# Patient Record
Sex: Female | Born: 2000 | Race: Black or African American | Hispanic: No | Marital: Single | Smoking: Never smoker
Health system: Southern US, Community
[De-identification: ages and names within clinical notes are randomized; demographics above are authoritative.]

## PROBLEM LIST (undated history)

## (undated) ENCOUNTER — Ambulatory Visit: Admission: EM | Payer: MEDICAID | Source: Home / Self Care

## (undated) ENCOUNTER — Ambulatory Visit: Payer: MEDICAID | Source: Home / Self Care

## (undated) DIAGNOSIS — L309 Dermatitis, unspecified: Secondary | ICD-10-CM

## (undated) DIAGNOSIS — J45909 Unspecified asthma, uncomplicated: Secondary | ICD-10-CM

## (undated) DIAGNOSIS — R41840 Attention and concentration deficit: Secondary | ICD-10-CM

## (undated) DIAGNOSIS — J302 Other seasonal allergic rhinitis: Secondary | ICD-10-CM

## (undated) HISTORY — PX: TONSILLECTOMY: SUR1361

---
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
## 2022-03-12 ENCOUNTER — Ambulatory Visit (INDEPENDENT_AMBULATORY_CARE_PROVIDER_SITE_OTHER): Payer: Medicaid Other

## 2022-03-12 ENCOUNTER — Ambulatory Visit
Admission: EM | Admit: 2022-03-12 | Discharge: 2022-03-12 | Disposition: A | Discharge status: Home / Self Care | Payer: Medicaid Other | Attending: Emergency Medicine | Admitting: Emergency Medicine

## 2022-03-12 DIAGNOSIS — R509 Fever, unspecified: Secondary | ICD-10-CM

## 2022-03-12 DIAGNOSIS — J208 Acute bronchitis due to other specified organisms: Secondary | ICD-10-CM

## 2022-03-12 DIAGNOSIS — R059 Cough, unspecified: Secondary | ICD-10-CM | POA: Diagnosis not present

## 2022-03-12 DIAGNOSIS — B9689 Other specified bacterial agents as the cause of diseases classified elsewhere: Secondary | ICD-10-CM

## 2022-03-12 DIAGNOSIS — R0689 Other abnormalities of breathing: Secondary | ICD-10-CM | POA: Diagnosis not present

## 2022-03-12 MED ORDER — GUAIFENESIN 400 MG PO TABS: ORAL_TABLET | ORAL | 0 refills | Status: DC

## 2022-03-12 MED ORDER — ALBUTEROL SULFATE HFA 108 (90 BASE) MCG/ACT IN AERS: 2.0000 | INHALATION_SPRAY | Freq: Four times a day (QID) | RESPIRATORY_TRACT | 0 refills | Status: DC | PRN

## 2022-03-12 MED ORDER — PROMETHAZINE-DM 6.25-15 MG/5ML PO SYRP: 5.0000 mL | ORAL_SOLUTION | Freq: Four times a day (QID) | ORAL | 0 refills | Status: DC | PRN

## 2022-03-12 MED ORDER — AMOXICILLIN-POT CLAVULANATE 875-125 MG PO TABS: 1.0000 | ORAL_TABLET | Freq: Two times a day (BID) | ORAL | 0 refills | Status: AC

## 2022-03-12 NOTE — ED Provider Notes (Signed)
[facility]    CSN: [id number] Arrival date & time: 03/12/22  1730    HISTORY   Chief Complaint  Patient presents with   Cough   HPI Mercedes Hoover is a pleasant, 21 y.o. female who presents to urgent care today. Patient states her symptoms started 4 days ago.  Patient reports taking 2 home COVID-19 tests which were negative.  Patient states she has had intermittent tactile fever, cough productive of dark, thick, metallic tasting sputum, headache, body aches, sore throat, purulent nasal drainage, nasal congestion.  Patient denies known sick contacts.  Patient states she has not tried any medications to alleviate her symptoms.  The history is provided by the patient.   Past Medical History:  Diagnosis Date   Asthma    Attention deficit    Eczema    Seasonal allergies    There are no problems to display for this patient.  Past Surgical History:  Procedure Laterality Date   TONSILLECTOMY     OB History   No obstetric history on file.    Home Medications    Prior to Admission medications   Medication Sig Start Date End Date Taking? Authorizing Provider  albuterol (VENTOLIN HFA) 108 (90 Base) MCG/ACT inhaler Inhale 2 puffs into the lungs every 6 (six) hours as needed for wheezing or shortness of breath (Cough). 03/12/22  Yes Theadora Rama Scales, PA-C  amoxicillin-clavulanate (AUGMENTIN) 875-125 MG tablet Take 1 tablet by mouth 2 (two) times daily for 5 days. 03/12/22 03/17/22 Yes Theadora Rama Scales, PA-C  guaifenesin (HUMIBID E) 400 MG TABS tablet Take 1 tablet 3 times daily as needed for chest congestion and cough 03/12/22  Yes Theadora Rama Scales, PA-C  promethazine-dextromethorphan (PROMETHAZINE-DM) 6.25-15 MG/5ML syrup Take 5 mLs by mouth 4 (four) times daily as needed for cough. 03/12/22  Yes Theadora Rama Scales, PA-C  EPINEPHrine (EPIPEN 2-PAK) 0.3 mg/0.3 mL IJ SOAJ injection Inject 0.3 mg into the muscle once as needed (severe allergic reaction).  05/28/20   Reichert, Wyvonnia Dusky, MD  hydrOXYzine (ATARAX/VISTARIL) 25 MG tablet Take 1 tablet (25 mg total) by mouth every 6 (six) hours as needed. 03/09/21   Sloan Leiter, DO  mineral oil-hydrophilic petrolatum (AQUAPHOR) ointment Apply 1 application topically every other day. 02/03/15   [provider]  PRESCRIPTION MEDICATION Apply 1 application topically daily. Compounded cream: triamcinolone cream 0.1%/ lac hydrin 12%/cetaphil cream 1:1:1    [provider]    Family History History reviewed. No pertinent family history. Social History Social History   Tobacco Use   Smoking status: Never    Passive exposure: Yes   Smokeless tobacco: Never  Vaping Use   Vaping Use: Never used  Substance Use Topics   Alcohol use: Yes    Comment: occasional   Drug use: No   Allergies   Fish allergy, Other, Peanut-containing drug products, and Shellfish allergy  Review of Systems Review of Systems Pertinent findings revealed after performing a 14 point review of systems has been noted in the history of present illness.  Physical Exam Triage Vital Signs ED Triage Vitals  Enc Vitals Group     BP 04/27/21 0827 (!) 147/82     Pulse Rate 04/27/21 0827 72     Resp 04/27/21 0827 18     Temp 04/27/21 0827 98.3 F (36.8 C)     Temp Source 04/27/21 0827 Oral     SpO2 04/27/21 0827 98 %     Weight --  Height --      Head Circumference --      Peak Flow --      Pain Score 04/27/21 0826 5     Pain Loc --      Pain Edu? --      Excl. in GC? --   No data found.  Updated Vital Signs BP 132/87 (BP Location: Right Arm)   Pulse (!) 106   Temp 98.9 F (37.2 C) (Oral)   SpO2 97%   Physical Exam Vitals and nursing note reviewed.  Constitutional:      General: She is awake. She is not in acute distress.    Appearance: Normal appearance. She is well-developed and well-groomed. She is morbidly obese. She is ill-appearing. She is not toxic-appearing or diaphoretic.  HENT:      Head: Normocephalic and atraumatic.     Salivary Glands: Right salivary gland is not diffusely enlarged or tender. Left salivary gland is not diffusely enlarged or tender.     Right Ear: Tympanic membrane, ear canal and external ear normal. No drainage. No middle ear effusion. There is no impacted cerumen. Tympanic membrane is not erythematous or bulging.     Left Ear: Tympanic membrane, ear canal and external ear normal. No drainage.  No middle ear effusion. There is no impacted cerumen. Tympanic membrane is not erythematous or bulging.     Nose: Mucosal edema, congestion and rhinorrhea present. No nasal deformity or septal deviation. Rhinorrhea is purulent.     Right Turbinates: Swollen. Not enlarged or pale.     Left Turbinates: Swollen. Not enlarged or pale.     Right Sinus: No maxillary sinus tenderness or frontal sinus tenderness.     Left Sinus: No maxillary sinus tenderness or frontal sinus tenderness.     Mouth/Throat:     Lips: Pink. No lesions.     Mouth: Mucous membranes are moist. No oral lesions.     Pharynx: Oropharynx is clear. Uvula midline. No posterior oropharyngeal erythema or uvula swelling.     Tonsils: No tonsillar exudate. 0 on the right. 0 on the left.  Eyes:     General: Lids are normal.        Right eye: No discharge.        Left eye: No discharge.     Extraocular Movements: Extraocular movements intact.     Conjunctiva/sclera: Conjunctivae normal.     Right eye: Right conjunctiva is not injected.     Left eye: Left conjunctiva is not injected.  Neck:     Trachea: Trachea and phonation normal.  Cardiovascular:     Rate and Rhythm: Normal rate and regular rhythm.     Pulses: Normal pulses.     Heart sounds: Normal heart sounds. No murmur heard.    No friction rub. No gallop.  Pulmonary:     Effort: Pulmonary effort is normal. No tachypnea, bradypnea, accessory muscle usage, prolonged expiration, respiratory distress or retractions.     Breath sounds: No  stridor, decreased air movement or transmitted upper airway sounds. Examination of the right-upper field reveals rhonchi and rales. Examination of the left-upper field reveals rhonchi and rales. Examination of the right-middle field reveals rhonchi and rales. Examination of the left-middle field reveals rhonchi and rales. Examination of the right-lower field reveals rhonchi and rales. Examination of the left-lower field reveals rhonchi and rales. Rhonchi and rales present. No decreased breath sounds or wheezing.  Chest:     Chest wall: No tenderness.  Musculoskeletal:  General: Normal range of motion.     Cervical back: Full passive range of motion without pain, normal range of motion and neck supple. Normal range of motion.  Lymphadenopathy:     Cervical: Cervical adenopathy present.     Right cervical: Superficial cervical adenopathy and posterior cervical adenopathy present.     Left cervical: Superficial cervical adenopathy and posterior cervical adenopathy present.  Skin:    General: Skin is warm and dry.     Findings: No erythema or rash.  Neurological:     General: No focal deficit present.     Mental Status: She is alert and oriented to person, place, and time.  Psychiatric:        Mood and Affect: Mood normal.        Behavior: Behavior normal. Behavior is cooperative.     Visual Acuity Right Eye Distance:   Left Eye Distance:   Bilateral Distance:    Right Eye Near:   Left Eye Near:    Bilateral Near:     [facility] / Diagnostics / Procedures:     Radiology DG Chest 2 View  Result Date: 03/12/2022 CLINICAL DATA:  Decreased breath sounds with fever and productive cough. EXAM: CHEST - 2 VIEW COMPARISON:  Radiographs 03/03/2021. FINDINGS: Artifact on the frontal examination. The heart size and mediastinal contours are stable. There is possible mild central airway thickening, but no evidence of edema, confluent airspace opacity, pleural effusion or pneumothorax. The  bones appear unremarkable. IMPRESSION: Possible central airway thickening as can be seen with bronchitis. No evidence of pneumonia. Electronically Signed   By: Carey Bullocks M.D.   On: 03/12/2022 19:16    Procedures Procedures (including critical care time) EKG  Pending results:  Labs Reviewed - No data to display  Medications Ordered in UC: Medications - No data to display  UC Diagnoses / Final Clinical Impressions(s)   I have reviewed the triage vital signs and the nursing notes.  Pertinent labs & imaging results that were available during my care of the patient were reviewed by me and considered in my medical decision making (see chart for details).    Final diagnoses:  Acute bacterial bronchitis   Patient provided with an albuterol inhaler and cough medication.  Patient advised to begin Augmentin for presumed bacterial infection.  Return precautions advised.  ED Prescriptions     Medication Sig Dispense Auth. Provider   amoxicillin-clavulanate (AUGMENTIN) 875-125 MG tablet Take 1 tablet by mouth 2 (two) times daily for 5 days. 10 tablet Theadora Rama Scales, PA-C   albuterol (VENTOLIN HFA) 108 (90 Base) MCG/ACT inhaler Inhale 2 puffs into the lungs every 6 (six) hours as needed for wheezing or shortness of breath (Cough). 18 g Theadora Rama Scales, PA-C   promethazine-dextromethorphan (PROMETHAZINE-DM) 6.25-15 MG/5ML syrup Take 5 mLs by mouth 4 (four) times daily as needed for cough. 118 mL Theadora Rama Scales, PA-C   guaifenesin (HUMIBID E) 400 MG TABS tablet Take 1 tablet 3 times daily as needed for chest congestion and cough 21 tablet Theadora Rama Scales, PA-C      PDMP not reviewed this encounter.  Disposition Upon Discharge:  Condition: stable for discharge home Home: take medications as prescribed; routine discharge instructions as discussed; follow up as advised.  Patient presented with an acute illness with associated systemic symptoms and significant  discomfort requiring urgent management. In my opinion, this is a condition that a prudent lay person (someone who possesses an average knowledge of health and  medicine) may potentially expect to result in complications if not addressed urgently such as respiratory distress, impairment of bodily function or dysfunction of bodily organs.   Routine symptom specific, illness specific and/or disease specific instructions were discussed with the patient and/or caregiver at length.   As such, the patient has been evaluated and assessed, work-up was performed and treatment was provided in alignment with urgent care protocols and evidence based medicine.  Patient/parent/caregiver has been advised that the patient may require follow up for further testing and treatment if the symptoms continue in spite of treatment, as clinically indicated and appropriate.  If the patient was tested for COVID-19, Influenza and/or RSV, then the patient/parent/guardian was advised to isolate at home pending the results of his/her diagnostic coronavirus test and potentially longer if they're positive. I have also advised pt that if his/her COVID-19 test returns positive, it's recommended to self-isolate for at least 10 days after symptoms first appeared AND until fever-free for 24 hours without fever reducer AND other symptoms have improved or resolved. Discussed self-isolation recommendations as well as instructions for household member/close contacts as per the [facility] and [place] DHHS, and also gave patient the COVID packet with this information.  Patient/parent/caregiver has been advised to return to the [facility] or PCP in 3-5 days if no better; to PCP or the Emergency Department if new signs and symptoms develop, or if the current signs or symptoms continue to change or worsen for further workup, evaluation and treatment as clinically indicated and appropriate  The patient will follow up with their current PCP if and as advised. If the patient  does not currently have a PCP we will assist them in obtaining one.   The patient may need specialty follow up if the symptoms continue, in spite of conservative treatment and management, for further workup, evaluation, consultation and treatment as clinically indicated and appropriate.  Patient/parent/caregiver verbalized understanding and agreement of plan as discussed.  All questions were addressed during visit.  Please see discharge instructions below for further details of plan.  Discharge Instructions:   Discharge Instructions      Your chest x-ray was not concerning for pneumonia but it did show bronchitis.    Because you continue to have fevers and the sputum that you are coughing up as an abnormal taste, I believe that your bronchitis is bacterial at this point.  Please see the list below for recommended medications, dosages and frequencies to provide relief of your current symptoms:    Augmentin (amoxicillin - clavulanic acid):  take 1 tablet twice daily for 5 days, you can take it with or without food.  This antibiotic can cause upset stomach, this will resolve once antibiotics are complete.  You are welcome to use a probiotic, eat yogurt, take Imodium while taking this medication.  Please avoid other systemic medications such as Maalox, Pepto-Bismol or milk of magnesia as they can interfere with your body's ability to absorb the antibiotics.   ProAir, Ventolin, Proventil (albuterol): This inhaled medication contains a short acting beta agonist bronchodilator.  This medication works on the smooth muscle that opens and constricts of your airways by relaxing the muscle.  The result of relaxation of the smooth muscle is increased air movement and improved work of breathing.  This is a short acting medication that can be used every 4-6 hours as needed for increased work of breathing, shortness of breath, wheezing and excessive coughing.  I have provided you with a prescription.    Robitussin, Mucinex (  guaifenesin): This is an expectorant.  This helps break up chest congestion and loosen up thick nasal drainage making phlegm and drainage more liquid and therefore easier to remove.  I recommend being 400 mg three times daily as needed.      Promethazine DM: Promethazine is both a nasal decongestant and an antinausea medication that makes most patients feel fairly sleepy.  The DM is dextromethorphan, a cough suppressant found in many over-the-counter cough medications.  Please take 5 mL before bedtime to minimize your cough which will help you sleep better.  I have sent a prescription for this medication to your pharmacy.   Please follow-up within the next 5-7 days either with your primary care provider or urgent care if your symptoms do not resolve.  If you do not have a primary care provider, we will assist you in finding one.   Thank you for visiting urgent care today.  We appreciate the opportunity to participate in your care.     This office note has been dictated using Teaching laboratory technicianDragon speech recognition software.  Unfortunately, this method of dictation can sometimes lead to typographical or grammatical errors.  I apologize for your inconvenience in advance if this occurs.  Please do not hesitate to reach out to me if clarification is needed.      Theadora RamaMorgan, Avanni Turnbaugh Scales, [place] JerseyPA-C 03/13/22 [phone]

## 2022-03-12 NOTE — Discharge Instructions (Addendum)
Your chest x-ray was not concerning for pneumonia but it did show bronchitis.    Because you continue to have fevers and the sputum that you are coughing up as an abnormal taste, I believe that your bronchitis is bacterial at this point.  Please see the list below for recommended medications, dosages and frequencies to provide relief of your current symptoms:    Augmentin (amoxicillin - clavulanic acid):  take 1 tablet twice daily for 5 days, you can take it with or without food.  This antibiotic can cause upset stomach, this will resolve once antibiotics are complete.  You are welcome to use a probiotic, eat yogurt, take Imodium while taking this medication.  Please avoid other systemic medications such as Maalox, Pepto-Bismol or milk of magnesia as they can interfere with your body's ability to absorb the antibiotics.   ProAir, Ventolin, Proventil (albuterol): This inhaled medication contains a short acting beta agonist bronchodilator.  This medication works on the smooth muscle that opens and constricts of your airways by relaxing the muscle.  The result of relaxation of the smooth muscle is increased air movement and improved work of breathing.  This is a short acting medication that can be used every 4-6 hours as needed for increased work of breathing, shortness of breath, wheezing and excessive coughing.  I have provided you with a prescription.   Robitussin, Mucinex (guaifenesin): This is an expectorant.  This helps break up chest congestion and loosen up thick nasal drainage making phlegm and drainage more liquid and therefore easier to remove.  I recommend being 400 mg three times daily as needed.      Promethazine DM: Promethazine is both a nasal decongestant and an antinausea medication that makes most patients feel fairly sleepy.  The DM is dextromethorphan, a cough suppressant found in many over-the-counter cough medications.  Please take 5 mL before bedtime to minimize your cough which will  help you sleep better.  I have sent a prescription for this medication to your pharmacy.   Please follow-up within the next 5-7 days either with your primary care provider or urgent care if your symptoms do not resolve.  If you do not have a primary care provider, we will assist you in finding one.   Thank you for visiting urgent care today.  We appreciate the opportunity to participate in your care.

## 2022-03-12 NOTE — ED Notes (Signed)
Symptoms started 4 days ago, Patient took 2 covid test and they were negative, Productive cough, sore throat, headache, nasal congestion. Main concern now is sore throat and nasal congestion.  Pain 0/10  LMP: IUD  No harmful thoughts

## 2022-10-21 ENCOUNTER — Ambulatory Visit
Admission: EM | Admit: 2022-10-21 | Discharge: 2022-10-21 | Disposition: A | Discharge status: Home / Self Care | Payer: Medicaid Other

## 2022-10-21 DIAGNOSIS — J4530 Mild persistent asthma, uncomplicated: Secondary | ICD-10-CM | POA: Diagnosis not present

## 2022-10-21 DIAGNOSIS — B9789 Other viral agents as the cause of diseases classified elsewhere: Secondary | ICD-10-CM | POA: Diagnosis not present

## 2022-10-21 DIAGNOSIS — J329 Chronic sinusitis, unspecified: Secondary | ICD-10-CM

## 2022-10-21 DIAGNOSIS — J309 Allergic rhinitis, unspecified: Secondary | ICD-10-CM | POA: Diagnosis not present

## 2022-10-21 LAB — POCT RAPID STREP A (OFFICE): Rapid Strep A Screen: NEGATIVE

## 2022-10-21 MED ORDER — CETIRIZINE HCL 10 MG PO TABS: 10.0000 mg | ORAL_TABLET | Freq: Every day | ORAL | 0 refills | Status: AC

## 2022-10-21 MED ORDER — ALBUTEROL SULFATE HFA 108 (90 BASE) MCG/ACT IN AERS: 2.0000 | INHALATION_SPRAY | Freq: Four times a day (QID) | RESPIRATORY_TRACT | 0 refills | Status: DC | PRN

## 2022-10-21 MED ORDER — PREDNISONE 20 MG PO TABS: ORAL_TABLET | ORAL | 0 refills | Status: DC

## 2022-10-21 MED ORDER — PSEUDOEPHEDRINE HCL 60 MG PO TABS: 60.0000 mg | ORAL_TABLET | Freq: Three times a day (TID) | ORAL | 0 refills | Status: AC | PRN

## 2022-10-21 MED ORDER — PROMETHAZINE-DM 6.25-15 MG/5ML PO SYRP: 5.0000 mL | ORAL_SOLUTION | Freq: Three times a day (TID) | ORAL | 0 refills | Status: DC | PRN

## 2022-10-21 NOTE — ED Triage Notes (Signed)
Pt reports fever, sore throat and nasal congestion x 3 days. Mucinex gives some relief.

## 2022-10-21 NOTE — ED Provider Notes (Signed)
[facility]  Note:  This document was prepared using Conservation officer, historic buildings and may include unintentional dictation errors.  MRN: [id number] DOB: December 10, 2000  Subjective:   Mercedes Hoover is a 22 y.o. female presenting for 3-day history of acute onset fever, throat pain, congestion.  No chest pain, shortness of breath or wheezing.  She does have asthma and allergies.  Needs a refill on her inhaler.  She does take Singulair as well.  No current facility-administered medications for this encounter.  Current Outpatient Medications:    dextromethorphan-guaiFENesin (MUCINEX DM) 30-600 MG 12hr tablet, Take 1 tablet by mouth 2 (two) times daily., Disp: , Rfl:    etonogestrel (NEXPLANON) 68 MG IMPL implant, 1 each by Subdermal route once., Disp: , Rfl:    albuterol (VENTOLIN HFA) 108 (90 Base) MCG/ACT inhaler, Inhale 2 puffs into the lungs every 6 (six) hours as needed for wheezing or shortness of breath (Cough)., Disp: 18 g, Rfl: 0   EPINEPHrine (EPIPEN 2-PAK) 0.3 mg/0.3 mL IJ SOAJ injection, Inject 0.3 mg into the muscle once as needed (severe allergic reaction)., Disp: 1 each, Rfl: 1   guaifenesin (HUMIBID E) 400 MG TABS tablet, Take 1 tablet 3 times daily as needed for chest congestion and cough, Disp: 21 tablet, Rfl: 0   hydrOXYzine (ATARAX/VISTARIL) 25 MG tablet, Take 1 tablet (25 mg total) by mouth every 6 (six) hours as needed., Disp: 8 tablet, Rfl: 0   mineral oil-hydrophilic petrolatum (AQUAPHOR) ointment, Apply 1 application topically every other day., Disp: , Rfl:    PRESCRIPTION MEDICATION, Apply 1 application topically daily. Compounded cream: triamcinolone cream 0.1%/ lac hydrin 12%/cetaphil cream 1:1:1, Disp: , Rfl:    promethazine-dextromethorphan (PROMETHAZINE-DM) 6.25-15 MG/5ML syrup, Take 5 mLs by mouth 4 (four) times daily as needed for cough., Disp: 118 mL, Rfl: 0   Allergies  Allergen Reactions   Fish Allergy Anaphylaxis    Reaction to any  type of seafood   Other Anaphylaxis    Reaction to tree nuts   Peanut-Containing Drug Products Anaphylaxis    Reaction to any type of nuts   Shellfish Allergy Anaphylaxis    Reaction to any type of seafood    Past Medical History:  Diagnosis Date   Asthma    Attention deficit    Eczema    Seasonal allergies      Past Surgical History:  Procedure Laterality Date   TONSILLECTOMY      History reviewed. No pertinent family history.  Social History   Tobacco Use   Smoking status: Never    Passive exposure: Yes   Smokeless tobacco: Never  Vaping Use   Vaping Use: Never used  Substance Use Topics   Alcohol use: Yes    Comment: occasional   Drug use: Never    ROS   Objective:   Vitals: BP (!) 139/96 (BP Location: Right Wrist)   Pulse 90   Temp 99.6 F (37.6 C) (Oral)   Resp 18   SpO2 100%   Physical Exam Constitutional:      General: She is not in acute distress.    Appearance: Normal appearance. She is well-developed and normal weight. She is not ill-appearing, toxic-appearing or diaphoretic.  HENT:     Head: Normocephalic and atraumatic.     Right Ear: Tympanic membrane, ear canal and external ear normal. No drainage or tenderness. No middle ear effusion. There is no impacted cerumen. Tympanic membrane is not erythematous or bulging.  Left Ear: Tympanic membrane, ear canal and external ear normal. No drainage or tenderness.  No middle ear effusion. There is no impacted cerumen. Tympanic membrane is not erythematous or bulging.     Nose: Congestion and rhinorrhea present.     Mouth/Throat:     Mouth: Mucous membranes are moist. No oral lesions.     Pharynx: No pharyngeal swelling, oropharyngeal exudate, posterior oropharyngeal erythema or uvula swelling.     Tonsils: No tonsillar exudate or tonsillar abscesses.  Eyes:     General: No scleral icterus.       Right eye: No discharge.        Left eye: No discharge.     Extraocular Movements: Extraocular  movements intact.     Right eye: Normal extraocular motion.     Left eye: Normal extraocular motion.     Conjunctiva/sclera: Conjunctivae normal.  Cardiovascular:     Rate and Rhythm: Normal rate and regular rhythm.     Heart sounds: Normal heart sounds. No murmur heard.    No friction rub. No gallop.  Pulmonary:     Effort: Pulmonary effort is normal. No respiratory distress.     Breath sounds: No stridor. No wheezing, rhonchi or rales.  Chest:     Chest wall: No tenderness.  Musculoskeletal:     Cervical back: Normal range of motion and neck supple.  Lymphadenopathy:     Cervical: No cervical adenopathy.  Skin:    General: Skin is warm and dry.  Neurological:     General: No focal deficit present.     Mental Status: She is alert and oriented to person, place, and time.  Psychiatric:        Mood and Affect: Mood normal.        Behavior: Behavior normal.     Results for orders placed or performed during the hospital encounter of 10/21/22 (from the past 24 hour(s))  POCT rapid strep A     Status: None   Collection Time: 10/21/22 12:02 PM  Result Value Ref Range   Rapid Strep A Screen Negative Negative    Assessment and Plan :   PDMP not reviewed this encounter.  1. Viral sinusitis   2. Allergic rhinitis, unspecified seasonality, unspecified trigger   3. Mild persistent asthma, uncomplicated    Patient declined a strep culture and COVID test.  Deferred imaging given clear cardiopulmonary exam, hemodynamically stable vital signs.  Refilled albuterol inhaler and offered her an oral prednisone course.  Otherwise, manage for viral sinusitis with supportive care.  Counseled patient on potential for adverse effects with medications prescribed/recommended today, ER and return-to-clinic precautions discussed, patient verbalized understanding.    Wallis Bamberg, PA-C 10/21/22 1252

## 2022-10-21 NOTE — Discharge Instructions (Signed)
We will manage this as a viral syndrome. For sore throat or cough try using a honey-based tea. Use 3 teaspoons of honey with juice squeezed from half lemon. Place shaved pieces of ginger into 1/2-1 cup of water and warm over stove top. Then mix the ingredients and repeat every 4 hours as needed. Please take Tylenol -  once every 6 hours for fevers, aches and pains. Hydrate very well with at least 2 liters (64 ounces) of water. Eat light meals such as soups (chicken and noodles, chicken wild rice, vegetable).  Do not eat any foods that you are allergic to.  Start an antihistamine like Zyrtec (  daily) for postnasal drainage, sinus congestion.  You can take this together with prednisone for 5 days. Once you finish prednisone you can start pseudoephedrine (Sudafed) at a dose of 60 mg 3 times a day or twice daily as needed for the same kind of congestion.  Use cough medication as needed.
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
## 2023-12-03 ENCOUNTER — Other Ambulatory Visit: Payer: Self-pay

## 2023-12-03 ENCOUNTER — Emergency Department (HOSPITAL_BASED_OUTPATIENT_CLINIC_OR_DEPARTMENT_OTHER)
Admission: EM | Admit: 2023-12-03 | Discharge: 2023-12-03 | Disposition: A | Discharge status: Home / Self Care | Payer: MEDICAID | Attending: Emergency Medicine | Admitting: Emergency Medicine

## 2023-12-03 ENCOUNTER — Encounter (HOSPITAL_BASED_OUTPATIENT_CLINIC_OR_DEPARTMENT_OTHER): Payer: Self-pay

## 2023-12-03 DIAGNOSIS — R519 Headache, unspecified: Secondary | ICD-10-CM | POA: Diagnosis not present

## 2023-12-03 DIAGNOSIS — R059 Cough, unspecified: Secondary | ICD-10-CM | POA: Diagnosis present

## 2023-12-03 DIAGNOSIS — R110 Nausea: Secondary | ICD-10-CM | POA: Insufficient documentation

## 2023-12-03 DIAGNOSIS — Z91010 Allergy to peanuts: Secondary | ICD-10-CM | POA: Insufficient documentation

## 2023-12-03 DIAGNOSIS — J3489 Other specified disorders of nose and nasal sinuses: Secondary | ICD-10-CM | POA: Diagnosis not present

## 2023-12-03 DIAGNOSIS — R197 Diarrhea, unspecified: Secondary | ICD-10-CM | POA: Insufficient documentation

## 2023-12-03 DIAGNOSIS — R0981 Nasal congestion: Secondary | ICD-10-CM | POA: Diagnosis not present

## 2023-12-03 DIAGNOSIS — J069 Acute upper respiratory infection, unspecified: Secondary | ICD-10-CM

## 2023-12-03 LAB — RESP PANEL BY RT-PCR (RSV, FLU A&B, COVID)  RVPGX2
Influenza A by PCR: NEGATIVE
Influenza B by PCR: NEGATIVE
Resp Syncytial Virus by PCR: NEGATIVE
SARS Coronavirus 2 by RT PCR: NEGATIVE

## 2023-12-03 NOTE — ED Notes (Signed)
 Discharge instructions reviewed with patient. Patient verbalizes understanding, no further questions at this time. Medications and follow up information provided. No acute distress noted at time of departure.

## 2023-12-03 NOTE — ED Provider Notes (Signed)
 [place] [facility] Provider Note   CSN: [id number] Arrival date & time: 12/03/23  1708     History  Chief Complaint  Patient presents with   Nasal Congestion    Mercedes Hoover is a 23 y.o. female who presents today with a 24-hour history of cough, congestion, diarrhea, and nausea.  She also has an associated headache.  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  She denies any dyspnea, denies any vomiting, does endorse occasional nausea but tolerates p.o. intake without difficulty.  HPI     Home Medications Prior to Admission medications   Medication Sig Start Date End Date Taking? Authorizing Provider  albuterol  (VENTOLIN  HFA) 108 (90 Base) MCG/ACT inhaler Inhale 2 puffs into the lungs every 6 (six) hours as needed for wheezing or shortness of breath (Cough). 10/21/22   Adolph Hoop, PA-C  cetirizine  (ZYRTEC  ALLERGY) 10 MG tablet Take 1 tablet (10 mg total) by mouth daily. 10/21/22   Adolph Hoop, PA-C  dextromethorphan-guaiFENesin  (MUCINEX  DM) 30-600 MG 12hr tablet Take 1 tablet by mouth 2 (two) times daily.    [provider]  EPINEPHrine  (EPIPEN  2-PAK) 0.3 mg/0.3 mL IJ SOAJ injection Inject 0.3 mg into the muscle once as needed (severe allergic reaction). 05/28/20   Olan Bering, MD  etonogestrel (NEXPLANON) 68 MG IMPL implant 1 each by Subdermal route once.    [provider]  hydrOXYzine  (ATARAX /VISTARIL ) 25 MG tablet Take 1 tablet (25 mg total) by mouth every 6 (six) hours as needed. 03/09/21   Teddi Favors, DO  mineral oil-hydrophilic petrolatum (AQUAPHOR) ointment Apply 1 application topically every other day. 02/03/15   [provider]  predniSONE  (DELTASONE ) 20 MG tablet Take 2 tablets daily with breakfast. 10/21/22   Adolph Hoop, PA-C  PRESCRIPTION MEDICATION Apply 1 application topically daily. Compounded cream: triamcinolone  cream 0.1%/  lac hydrin 12%/cetaphil cream 1:1:1    [provider]  promethazine -dextromethorphan (PROMETHAZINE -DM) 6.25-15 MG/5ML syrup Take 5 mLs by mouth 3 (three) times daily as needed for cough. 10/21/22   Adolph Hoop, PA-C  pseudoephedrine  (SUDAFED) 60 MG tablet Take 1 tablet (60 mg total) by mouth every 8 (eight) hours as needed for congestion. 10/21/22   Adolph Hoop, PA-C      Allergies    Fish allergy, Other, Peanut-containing drug products, and Shellfish allergy    Review of Systems   Review of Systems  HENT:  Positive for congestion and sinus pain. Negative for nosebleeds, postnasal drip and rhinorrhea.   Gastrointestinal:  Positive for diarrhea and nausea. Negative for vomiting.  Neurological:  Positive for headaches. Negative for dizziness, weakness and light-headedness.  All other systems reviewed and are negative.   Physical Exam Updated Vital Signs BP 138/89   Pulse 96   Temp 97.7 F (36.5 C)   Resp 20   Ht 5\' 3"  (1.6 m)   Wt 122.5 kg   LMP 11/25/2023 (Approximate)   SpO2 97%   BMI 47.83 kg/m  Physical Exam Vitals and nursing note reviewed.  Constitutional:      General: She is not in acute distress.    Appearance: Normal appearance.  HENT:     Head: Normocephalic and atraumatic.     Right Ear: Hearing, tympanic membrane, ear canal and external ear normal.     Left Ear: Hearing, tympanic membrane, ear canal and external ear normal.     Nose: Septal  deviation and congestion present.     Right Nostril: No epistaxis or occlusion.     Left Nostril: No epistaxis or occlusion.     Right Turbinates: Enlarged and swollen. Not pale.     Left Turbinates: Enlarged and swollen. Not pale.     Right Sinus: No maxillary sinus tenderness or frontal sinus tenderness.     Left Sinus: No maxillary sinus tenderness or frontal sinus tenderness.     Comments: Slight anterior right-sided septal deviation noted.  Erythema and edema noted to the bilateral nasal turbinates.     Mouth/Throat:     Mouth: Mucous membranes are moist.     Pharynx: Oropharynx is clear.  Eyes:     Extraocular Movements: Extraocular movements intact.     Conjunctiva/sclera: Conjunctivae normal.     Pupils: Pupils are equal, round, and reactive to light.  Cardiovascular:     Rate and Rhythm: Normal rate and regular rhythm.     Pulses: Normal pulses.     Heart sounds: Normal heart sounds. No murmur heard.    No friction rub. No gallop.  Pulmonary:     Effort: Pulmonary effort is normal.     Breath sounds: Normal breath sounds and air entry.  Abdominal:     General: Abdomen is flat. Bowel sounds are normal.     Palpations: Abdomen is soft.  Musculoskeletal:        General: Normal range of motion.     Cervical back: Normal range of motion and neck supple.     Right lower leg: No edema.     Left lower leg: No edema.  Skin:    General: Skin is warm and dry.     Capillary Refill: Capillary refill takes less than 2 seconds.  Neurological:     General: No focal deficit present.     Mental Status: She is alert. Mental status is at baseline.  Psychiatric:        Mood and Affect: Mood normal.     ED Results / Procedures / Treatments   Labs (all labs ordered are listed, but only abnormal results are displayed) Labs Reviewed  RESP PANEL BY RT-PCR (RSV, FLU A&B, COVID)  [id number]    EKG None  Radiology No results found.  Procedures Procedures    Medications Ordered in ED Medications - No data to display  ED Course/ Medical Decision Making/ A&P                                 Medical Decision Making  Medical Decision Making:   Mercedes Hoover is a 23 y.o. female who presented to the ED today with nasal congestion, cough, nausea, loose stools detailed above.     Complete initial physical exam performed, notably the patient  was alert and oriented in no apparent distress.  She is breathing normally without any signs of respiratory distress.  Bilateral TMs are normal as are  the external auditory canals.  She has clear and normal-appearing posterior oropharynx with postnasal drip noted.  Evaluation of the nose showed edematous and erythematous nasal mucosa with enlarged turbinates and a slight right sided septal deviation.  No cervical lymphadenopathy is noted.     Reviewed and confirmed nursing documentation for past medical history, family history, social history.    Initial Assessment:   With the patient's presentation of congestion, cough, diarrhea, most likely diagnosis is viral URI/viral syndrome.   Initial  Plan:  Obtain COVID/flu/RSV swabs to assess for viral etiologies. Objective evaluation as below reviewed   Initial Study Results:   Laboratory  All laboratory results reviewed without evidence of clinically relevant pathology.   Exceptions include: None   Reassessment and Plan:   Based on the clinical presentation, along with review of nasopharyngeal swab results, believe symptoms are most consistent with a nonspecific viral URI.  As such patient is to use over-the-counter cough and cold remedies as needed and ensure adequate oral hydration.  Follow-up with primary care as needed for monitoring of symptoms within the next 1 to 2 weeks.  Care plan discussed with patient, she understands and agrees has no further concerns at this time.          Final Clinical Impression(s) / ED Diagnoses Final diagnoses:  None    Rx / DC Orders ED Discharge Orders     None         Juanetta Nordmann, PA 12/03/23 1810    Scarlette Currier, MD 12/06/23 [phone]

## 2023-12-03 NOTE — Discharge Instructions (Signed)
 Please follow-up with your primary care within the next week to 2 weeks if your symptoms have not improved in that timeframe.  Otherwise take over-the-counter cough and cold remedies, be sure to increase your fluid intake, and take precautions to prevent spread such as handwashing.  Should your cough or breathing become worse, please present back to the emergency department for further evaluation.

## 2023-12-03 NOTE — ED Triage Notes (Signed)
 States last night started having abdominal pain, diarrhea and nausea. This morning started having congestion & headache. Denies abdominal pain or nausea currently.

## 2024-01-21 ENCOUNTER — Ambulatory Visit
Admission: EM | Admit: 2024-01-21 | Discharge: 2024-01-21 | Disposition: A | Discharge status: Home / Self Care | Payer: MEDICAID | Attending: Family Medicine | Admitting: Family Medicine

## 2024-01-21 DIAGNOSIS — A084 Viral intestinal infection, unspecified: Secondary | ICD-10-CM

## 2024-01-21 LAB — POCT URINALYSIS DIP (MANUAL ENTRY)
Bilirubin, UA: NEGATIVE
Blood, UA: NEGATIVE
Glucose, UA: NEGATIVE mg/dL
Ketones, POC UA: NEGATIVE mg/dL
Leukocytes, UA: NEGATIVE
Nitrite, UA: NEGATIVE
Protein Ur, POC: NEGATIVE mg/dL
Spec Grav, UA: 1.02 (ref 1.010–1.025)
Urobilinogen, UA: 1 U/dL
pH, UA: 6.5 (ref 5.0–8.0)

## 2024-01-21 LAB — POCT URINE PREGNANCY: Preg Test, Ur: NEGATIVE

## 2024-01-21 MED ORDER — ONDANSETRON 8 MG PO TBDP: 8.0000 mg | ORAL_TABLET | Freq: Three times a day (TID) | ORAL | 0 refills | Status: AC | PRN

## 2024-01-21 MED ORDER — LOPERAMIDE HCL 2 MG PO CAPS: 2.0000 mg | ORAL_CAPSULE | Freq: Two times a day (BID) | ORAL | 0 refills | Status: AC | PRN

## 2024-01-21 NOTE — ED Provider Notes (Signed)
 [facility]  Note:  This document was prepared using Conservation officer, historic buildings and may include unintentional dictation errors.  MRN: [id number] DOB: 2000/11/06  Subjective:   Austin Pongratz is a 23 y.o. female presenting for 1 day history of mid abdominal pain, vomiting this morning and 3 episodes of diarrhea.  Patient did eat out prior to the symptoms starting.  No fever, bloody stools, cough, sinus symptoms, chest pain, shortness of breath, recent antibiotic use, hospitalizations or long distance travel.  Has not eaten raw foods, drank unfiltered water.  No history of GI disorders including Crohn's, IBS, ulcerative colitis.   No current facility-administered medications for this encounter.  Current Outpatient Medications:    albuterol  (VENTOLIN  HFA) 108 (90 Base) MCG/ACT inhaler, Inhale 2 puffs into the lungs every 6 (six) hours as needed for wheezing or shortness of breath (Cough)., Disp: 18 g, Rfl: 0   cetirizine  (ZYRTEC  ALLERGY) 10 MG tablet, Take 1 tablet (10 mg total) by mouth daily., Disp: 30 tablet, Rfl: 0   dextromethorphan-guaiFENesin  (MUCINEX  DM) 30-600 MG 12hr tablet, Take 1 tablet by mouth 2 (two) times daily., Disp: , Rfl:    EPINEPHrine  (EPIPEN  2-PAK) 0.3 mg/0.3 mL IJ SOAJ injection, Inject 0.3 mg into the muscle once as needed (severe allergic reaction)., Disp: 1 each, Rfl: 1   etonogestrel (NEXPLANON) 68 MG IMPL implant, 1 each by Subdermal route once., Disp: , Rfl:    hydrOXYzine  (ATARAX /VISTARIL ) 25 MG tablet, Take 1 tablet (25 mg total) by mouth every 6 (six) hours as needed., Disp: 8 tablet, Rfl: 0   mineral oil-hydrophilic petrolatum (AQUAPHOR) ointment, Apply 1 application topically every other day., Disp: , Rfl:    predniSONE  (DELTASONE ) 20 MG tablet, Take 2 tablets daily with breakfast., Disp: 10 tablet, Rfl: 0   PRESCRIPTION MEDICATION, Apply 1 application topically daily. Compounded cream: triamcinolone  cream 0.1%/ lac hydrin  12%/cetaphil cream 1:1:1, Disp: , Rfl:    promethazine -dextromethorphan (PROMETHAZINE -DM) 6.25-15 MG/5ML syrup, Take 5 mLs by mouth 3 (three) times daily as needed for cough., Disp: 200 mL, Rfl: 0   pseudoephedrine  (SUDAFED) 60 MG tablet, Take 1 tablet (60 mg total) by mouth every 8 (eight) hours as needed for congestion., Disp: 30 tablet, Rfl: 0   Allergies  Allergen Reactions   Fish Allergy Anaphylaxis    Reaction to any type of seafood   Other Anaphylaxis    Reaction to tree nuts   Peanut-Containing Drug Products Anaphylaxis    Reaction to any type of nuts   Shellfish Allergy Anaphylaxis    Reaction to any type of seafood    Past Medical History:  Diagnosis Date   Asthma    Attention deficit    Eczema    Seasonal allergies      Past Surgical History:  Procedure Laterality Date   TONSILLECTOMY      No family history on file.  Social History   Tobacco Use   Smoking status: Never    Passive exposure: Yes   Smokeless tobacco: Never  Vaping Use   Vaping status: Never Used  Substance Use Topics   Alcohol use: Yes    Comment: occasional   Drug use: Never    ROS   Objective:   Vitals: BP 129/81 (BP Location: Right Arm)   Pulse 82   Temp 98.7 F (37.1 C) (Oral)   Resp 20   LMP 01/19/2024   SpO2 98%   Physical Exam Constitutional:      General: She is  not in acute distress.    Appearance: Normal appearance. She is well-developed. She is not ill-appearing, toxic-appearing or diaphoretic.  HENT:     Head: Normocephalic and atraumatic.     Nose: Nose normal.     Mouth/Throat:     Mouth: Mucous membranes are moist.     Pharynx: Oropharynx is clear.  Eyes:     General: No scleral icterus.       Right eye: No discharge.        Left eye: No discharge.     Extraocular Movements: Extraocular movements intact.     Conjunctiva/sclera: Conjunctivae normal.  Cardiovascular:     Rate and Rhythm: Normal rate.  Pulmonary:     Effort: Pulmonary effort is normal.   Abdominal:     General: Bowel sounds are increased. There is no distension.     Palpations: Abdomen is soft. There is no mass.     Tenderness: There is generalized abdominal tenderness (mild). There is no right CVA tenderness, left CVA tenderness, guarding or rebound.  Skin:    General: Skin is warm and dry.  Neurological:     General: No focal deficit present.     Mental Status: She is alert and oriented to person, place, and time.  Psychiatric:        Mood and Affect: Mood normal.        Behavior: Behavior normal.        Thought Content: Thought content normal.        Judgment: Judgment normal.     Results for orders placed or performed during the hospital encounter of 01/21/24 (from the past 24 hours)  POCT urinalysis dipstick     Status: None   Collection Time: 01/21/24 10:15 AM  Result Value Ref Range   Color, UA yellow yellow   Clarity, UA clear clear   Glucose, UA negative negative mg/dL   Bilirubin, UA negative negative   Ketones, POC UA negative negative mg/dL   Spec Grav, UA 8.979 8.989 - 1.025   Blood, UA negative negative   pH, UA 6.5 5.0 - 8.0   Protein Ur, POC negative negative mg/dL   Urobilinogen, UA 1.0 0.2 or 1.0 E.U./dL   Nitrite, UA Negative Negative   Leukocytes, UA Negative Negative  POCT urine pregnancy     Status: None   Collection Time: 01/21/24 10:15 AM  Result Value Ref Range   Preg Test, Ur Negative Negative    Assessment and Plan :   PDMP not reviewed this encounter.  1. Viral gastroenteritis    Will manage for suspected viral gastroenteritis with supportive care.  Recommended patient hydrate well, eat light meals and maintain electrolytes.  Will use Zofran  and Imodium  for nausea, vomiting and diarrhea. Counseled patient on potential for adverse effects with medications prescribed/recommended today, ER and return-to-clinic precautions discussed, patient verbalized understanding.    Christopher Savannah, PA-C 01/21/24 1049

## 2024-01-21 NOTE — Discharge Instructions (Signed)

## 2024-01-21 NOTE — ED Triage Notes (Signed)
 Pt c/o mid abd pain stared yesterday-vomited x 1 this am and diarrhea x 3 episodes that started -NAD-steady gait

## 2024-02-02 ENCOUNTER — Institutional Professional Consult (permissible substitution): Payer: MEDICAID | Admitting: Plastic Surgery

## 2024-02-16 ENCOUNTER — Institutional Professional Consult (permissible substitution): Payer: MEDICAID

## 2024-05-07 ENCOUNTER — Ambulatory Visit
Admission: EM | Admit: 2024-05-07 | Discharge: 2024-05-07 | Disposition: A | Discharge status: Home / Self Care | Payer: MEDICAID | Attending: Family Medicine | Admitting: Family Medicine

## 2024-05-07 DIAGNOSIS — B370 Candidal stomatitis: Secondary | ICD-10-CM | POA: Diagnosis not present

## 2024-05-07 DIAGNOSIS — B9789 Other viral agents as the cause of diseases classified elsewhere: Secondary | ICD-10-CM

## 2024-05-07 DIAGNOSIS — J4531 Mild persistent asthma with (acute) exacerbation: Secondary | ICD-10-CM | POA: Diagnosis not present

## 2024-05-07 DIAGNOSIS — R52 Pain, unspecified: Secondary | ICD-10-CM

## 2024-05-07 DIAGNOSIS — J988 Other specified respiratory disorders: Secondary | ICD-10-CM

## 2024-05-07 DIAGNOSIS — H6991 Unspecified Eustachian tube disorder, right ear: Secondary | ICD-10-CM | POA: Diagnosis not present

## 2024-05-07 LAB — POC COVID19/FLU A&B COMBO
Covid Antigen, POC: NEGATIVE
Influenza A Antigen, POC: NEGATIVE
Influenza B Antigen, POC: NEGATIVE

## 2024-05-07 MED ORDER — PREDNISONE 20 MG PO TABS: ORAL_TABLET | ORAL | 0 refills | Status: DC

## 2024-05-07 MED ORDER — ALBUTEROL SULFATE HFA 108 (90 BASE) MCG/ACT IN AERS: 2.0000 | INHALATION_SPRAY | Freq: Four times a day (QID) | RESPIRATORY_TRACT | 0 refills | Status: AC | PRN

## 2024-05-07 MED ORDER — PROMETHAZINE-DM 6.25-15 MG/5ML PO SYRP: 5.0000 mL | ORAL_SOLUTION | Freq: Three times a day (TID) | ORAL | 0 refills | Status: AC | PRN

## 2024-05-07 MED ORDER — NYSTATIN 100000 UNIT/ML MT SUSP: 500000.0000 [IU] | Freq: Four times a day (QID) | OROMUCOSAL | 0 refills | Status: AC

## 2024-05-07 NOTE — Discharge Instructions (Signed)
 We will manage this as a viral syndrome. For sore throat or cough try using a honey-based tea. Use 3 teaspoons of honey with juice squeezed from half lemon. Place shaved pieces of ginger into 1/2-1 cup of water and warm over stove top. Then mix the ingredients and repeat every 4 hours as needed. Please take Tylenol  500mg -650mg  once every 6 hours for fevers, aches and pains. Hydrate very well with at least 2 liters (64 ounces) of water. Eat light meals such as soups (chicken and noodles, chicken wild rice, vegetable).  Do not eat any foods that you are allergic to.  Start an antihistamine like Zyrtec  (10mg  daily) for postnasal drainage, sinus congestion.  You can take this together with prednisone  and albuterol  for your sinuses, asthma. Use cough syrup as needed. Start Nystatin for oral thrush.

## 2024-05-07 NOTE — ED Provider Notes (Signed)
 [facility]  Note:  This document was prepared using Conservation officer, historic buildings and may include unintentional dictation errors.  MRN: [id number] DOB: [birth year]  Subjective:   Mercedes Hoover is a 23 y.o. female presenting for 3-day history of a productive cough, right ear fullness, buzzing, sinus and chest congestion, throat pain, runny nose, sneezing.  Has also had shob, wheezing. Has a history of allergies and asthma.  Needs a refill of her inhaler.  No smoking of any kind including cigarettes, cigars, vaping, marijuana use.    No current facility-administered medications for this encounter.  Current Outpatient Medications:    albuterol  (VENTOLIN  HFA) 108 (90 Base) MCG/ACT inhaler, Inhale 2 puffs into the lungs every 6 (six) hours as needed for wheezing or shortness of breath (Cough)., Disp: 18 g, Rfl: 0   cetirizine  (ZYRTEC  ALLERGY) 10 MG tablet, Take 1 tablet (10 mg total) by mouth daily., Disp: 30 tablet, Rfl: 0   dextromethorphan-guaiFENesin  (MUCINEX  DM) 30-600 MG 12hr tablet, Take 1 tablet by mouth 2 (two) times daily., Disp: , Rfl:    EPINEPHrine  (EPIPEN  2-PAK) 0.3 mg/0.3 mL IJ SOAJ injection, Inject 0.3 mg into the muscle once as needed (severe allergic reaction)., Disp: 1 each, Rfl: 1   etonogestrel (NEXPLANON) 68 MG IMPL implant, 1 each by Subdermal route once., Disp: , Rfl:    hydrOXYzine  (ATARAX /VISTARIL ) 25 MG tablet, Take 1 tablet (25 mg total) by mouth every 6 (six) hours as needed., Disp: 8 tablet, Rfl: 0   loperamide  (IMODIUM ) 2 MG capsule, Take 1 capsule (2 mg total) by mouth 2 (two) times daily as needed for diarrhea or loose stools., Disp: 14 capsule, Rfl: 0   mineral oil-hydrophilic petrolatum (AQUAPHOR) ointment, Apply 1 application topically every other day., Disp: , Rfl:    ondansetron  (ZOFRAN -ODT) 8 MG disintegrating tablet, Take 1 tablet (8 mg total) by mouth every 8 (eight) hours as needed for nausea or vomiting., Disp: 20 tablet, Rfl:  0   predniSONE  (DELTASONE ) 20 MG tablet, Take 2 tablets daily with breakfast., Disp: 10 tablet, Rfl: 0   PRESCRIPTION MEDICATION, Apply 1 application topically daily. Compounded cream: triamcinolone  cream 0.1%/ lac hydrin 12%/cetaphil cream 1:1:1, Disp: , Rfl:    promethazine -dextromethorphan (PROMETHAZINE -DM) 6.25-15 MG/5ML syrup, Take 5 mLs by mouth 3 (three) times daily as needed for cough., Disp: 200 mL, Rfl: 0   pseudoephedrine  (SUDAFED) 60 MG tablet, Take 1 tablet (60 mg total) by mouth every 8 (eight) hours as needed for congestion., Disp: 30 tablet, Rfl: 0   Allergies  Allergen Reactions   Fish Allergy Anaphylaxis    Reaction to any type of seafood   Other Anaphylaxis    Reaction to tree nuts   Peanut-Containing Drug Products Anaphylaxis    Reaction to any type of nuts   Shellfish Allergy Anaphylaxis    Reaction to any type of seafood    Past Medical History:  Diagnosis Date   Asthma    Attention deficit    Eczema    Seasonal allergies      Past Surgical History:  Procedure Laterality Date   TONSILLECTOMY      No family history on file.  Social History   Tobacco Use   Smoking status: Never    Passive exposure: Yes   Smokeless tobacco: Never  Vaping Use   Vaping status: Never Used  Substance Use Topics   Alcohol use: Not Currently   Drug use: Never    ROS   Objective:  Vitals: BP 132/87 (BP Location: Right Arm)   Pulse (!) 109   Temp 98.8 F (37.1 C) (Oral)   Resp 20   LMP 05/03/2024   SpO2 98%   Physical Exam Constitutional:      General: She is not in acute distress.    Appearance: Normal appearance. She is well-developed and normal weight. She is not ill-appearing, toxic-appearing or diaphoretic.  HENT:     Head: Normocephalic and atraumatic.     Right Ear: Tympanic membrane, ear canal and external ear normal. No drainage or tenderness. No middle ear effusion. There is no impacted cerumen. Tympanic membrane is not erythematous or bulging.      Left Ear: Tympanic membrane, ear canal and external ear normal. No drainage or tenderness.  No middle ear effusion. There is no impacted cerumen. Tympanic membrane is not erythematous or bulging.     Nose: Nose normal. No congestion or rhinorrhea.     Mouth/Throat:     Mouth: Mucous membranes are moist. No oral lesions.     Pharynx: No pharyngeal swelling, oropharyngeal exudate, posterior oropharyngeal erythema or uvula swelling.     Tonsils: No tonsillar exudate or tonsillar abscesses.     Comments: Incidental finding of geographic tongue. Eyes:     General: No scleral icterus.       Right eye: No discharge.        Left eye: No discharge.     Extraocular Movements: Extraocular movements intact.     Right eye: Normal extraocular motion.     Left eye: Normal extraocular motion.     Conjunctiva/sclera: Conjunctivae normal.  Cardiovascular:     Rate and Rhythm: Normal rate and regular rhythm.     Heart sounds: Normal heart sounds. No murmur heard.    No friction rub. No gallop.  Pulmonary:     Effort: Pulmonary effort is normal. No respiratory distress.     Breath sounds: No stridor. No wheezing, rhonchi or rales.  Chest:     Chest wall: No tenderness.  Musculoskeletal:     Cervical back: Normal range of motion and neck supple.  Lymphadenopathy:     Cervical: No cervical adenopathy.  Skin:    General: Skin is warm and dry.  Neurological:     General: No focal deficit present.     Mental Status: She is alert and oriented to person, place, and time.  Psychiatric:        Mood and Affect: Mood normal.        Behavior: Behavior normal.    Results for orders placed or performed during the hospital encounter of 05/07/24 (from the past 24 hours)  POC Covid19/Flu A&B Antigen     Status: None   Collection Time: 05/07/24 11:34 AM  Result Value Ref Range   Influenza A Antigen, POC Negative Negative   Influenza B Antigen, POC Negative Negative   Covid Antigen, POC Negative Negative     Assessment and Plan :   PDMP not reviewed this encounter.  1. Mild persistent asthma with (acute) exacerbation   2. Oral thrush   3. Eustachian tube dysfunction, right    Given her respiratory symptoms, asthma, allergies, eustachian tube dysfunction recommended an oral prednisone  course.  Refilled her albuterol  inhaler.  Incidentally, was found to have oral thrush and recommended nystatin for this.  Use supportive care otherwise.  Will defer imaging for now.  Counseled patient on potential for adverse effects with medications prescribed/recommended today, ER and return-to-clinic precautions discussed, patient  verbalized understanding.    Christopher Savannah, PA-C 05/07/24 1134

## 2024-05-07 NOTE — ED Triage Notes (Signed)
 C/o prod cough, head/chest congestion, sore throat, runny nose, sneezing x 3 days-taking dimetap and tylenol  cold/flu-NAD-steady gait

## 2024-07-29 ENCOUNTER — Emergency Department (HOSPITAL_BASED_OUTPATIENT_CLINIC_OR_DEPARTMENT_OTHER): Payer: MEDICAID

## 2024-07-29 ENCOUNTER — Other Ambulatory Visit: Payer: Self-pay

## 2024-07-29 ENCOUNTER — Other Ambulatory Visit (HOSPITAL_BASED_OUTPATIENT_CLINIC_OR_DEPARTMENT_OTHER): Payer: Self-pay

## 2024-07-29 ENCOUNTER — Emergency Department (HOSPITAL_BASED_OUTPATIENT_CLINIC_OR_DEPARTMENT_OTHER)
Admission: EM | Admit: 2024-07-29 | Discharge: 2024-07-29 | Disposition: A | Discharge status: Home / Self Care | Payer: MEDICAID | Attending: Emergency Medicine | Admitting: Emergency Medicine

## 2024-07-29 ENCOUNTER — Encounter (HOSPITAL_BASED_OUTPATIENT_CLINIC_OR_DEPARTMENT_OTHER): Payer: Self-pay

## 2024-07-29 DIAGNOSIS — Z91010 Allergy to peanuts: Secondary | ICD-10-CM | POA: Insufficient documentation

## 2024-07-29 DIAGNOSIS — J45909 Unspecified asthma, uncomplicated: Secondary | ICD-10-CM | POA: Insufficient documentation

## 2024-07-29 DIAGNOSIS — J069 Acute upper respiratory infection, unspecified: Secondary | ICD-10-CM | POA: Insufficient documentation

## 2024-07-29 DIAGNOSIS — R059 Cough, unspecified: Secondary | ICD-10-CM | POA: Diagnosis present

## 2024-07-29 LAB — RESP PANEL BY RT-PCR (RSV, FLU A&B, COVID)  RVPGX2
Influenza A by PCR: NEGATIVE
Influenza B by PCR: NEGATIVE
Resp Syncytial Virus by PCR: NEGATIVE
SARS Coronavirus 2 by RT PCR: NEGATIVE

## 2024-07-29 MED ORDER — PREDNISONE 50 MG PO TABS
60.0000 mg | ORAL_TABLET | Freq: Once | ORAL | Status: AC
  Administered 2024-07-29: 60 mg via ORAL
  Filled 2024-07-29: qty 1

## 2024-07-29 MED ORDER — PREDNISONE 20 MG PO TABS
40.0000 mg | ORAL_TABLET | Freq: Every day | ORAL | 0 refills | Status: AC
  Filled 2024-07-29: qty 10, 5d supply, fill #0

## 2024-07-29 MED ORDER — ALBUTEROL SULFATE HFA 108 (90 BASE) MCG/ACT IN AERS
2.0000 | INHALATION_SPRAY | Freq: Once | RESPIRATORY_TRACT | Status: AC
  Administered 2024-07-29: 2 via RESPIRATORY_TRACT
  Filled 2024-07-29: qty 6.7

## 2024-07-29 NOTE — Discharge Instructions (Addendum)
 Take next dose of prednisone  tomorrow.  Continue 2 to 4 puffs of albuterol  every 4-6 hours as needed

## 2024-07-29 NOTE — ED Provider Notes (Signed)
 " [place] [facility] Provider Note   CSN: [id number] Arrival date & time: 07/29/24  9145     Patient presents with: Cough   Mercedes Hoover is a 24 y.o. female.   Patient here with cough congestion history of asthma.  Has had a lot of cough but no major sputum production.  Nothing makes it worse or better.  Denies any obvious fevers or chills.  No chest pain no shortness of breath.  No recent illness otherwise.  Does have history of asthma eczema and allergies.  The history is provided by the patient.       Prior to Admission medications  Medication Sig Start Date End Date Taking? Authorizing Provider  predniSONE  (DELTASONE ) 20 MG tablet Take 2 tablets (40 mg total) by mouth daily for 5 days. 07/29/24 08/03/24 Yes Zayed Griffie, DO  albuterol  (VENTOLIN  HFA) 108 (90 Base) MCG/ACT inhaler Inhale 2 puffs into the lungs every 6 (six) hours as needed for wheezing or shortness of breath (Cough). 05/07/24   Christopher Savannah, PA-C  cetirizine  (ZYRTEC  ALLERGY) 10 MG tablet Take 1 tablet (10 mg total) by mouth daily. 10/21/22   Christopher Savannah, PA-C  dextromethorphan-guaiFENesin  (MUCINEX  DM) 30-600 MG 12hr tablet Take 1 tablet by mouth 2 (two) times daily.    [provider]  EPINEPHrine  (EPIPEN  2-PAK) 0.3 mg/0.3 mL IJ SOAJ injection Inject 0.3 mg into the muscle once as needed (severe allergic reaction). 05/28/20   Donzetta Bernardino PARAS, MD  etonogestrel (NEXPLANON) 68 MG IMPL implant 1 each by Subdermal route once.    [provider]  hydrOXYzine  (ATARAX /VISTARIL ) 25 MG tablet Take 1 tablet (25 mg total) by mouth every 6 (six) hours as needed. 03/09/21   Elnor Jayson LABOR, DO  loperamide  (IMODIUM ) 2 MG capsule Take 1 capsule (2 mg total) by mouth 2 (two) times daily as needed for diarrhea or loose stools. 01/21/24   Christopher Savannah, PA-C  mineral oil-hydrophilic petrolatum (AQUAPHOR) ointment Apply 1 application topically every other day. 02/03/15   [provider]  nystatin  (MYCOSTATIN ) 100000 UNIT/ML suspension Take 5 mLs (500,000 Units total) by mouth 4 (four) times daily. 05/07/24   Christopher Savannah, PA-C  ondansetron  (ZOFRAN -ODT) 8 MG disintegrating tablet Take 1 tablet (8 mg total) by mouth every 8 (eight) hours as needed for nausea or vomiting. 01/21/24   Christopher Savannah, PA-C  PRESCRIPTION MEDICATION Apply 1 application topically daily. Compounded cream: triamcinolone  cream 0.1%/ lac hydrin 12%/cetaphil cream 1:1:1    [provider]  promethazine -dextromethorphan (PROMETHAZINE -DM) 6.25-15 MG/5ML syrup Take 5 mLs by mouth 3 (three) times daily as needed for cough. 05/07/24   Christopher Savannah, PA-C  pseudoephedrine  (SUDAFED) 60 MG tablet Take 1 tablet (60 mg total) by mouth every 8 (eight) hours as needed for congestion. 10/21/22   Christopher Savannah, PA-C    Allergies: Fish allergy, Other, Peanut-containing drug products, and Shellfish allergy    Review of Systems  Updated Vital Signs BP (!) 133/90 (BP Location: Right Arm)   Pulse 87   Temp 98.4 F (36.9 C) (Oral)   Resp 15   Ht 5' 3 (1.6 m)   Wt 129.7 kg   LMP 06/04/2024 (Approximate)   SpO2 98%   BMI 50.66 kg/m   Physical Exam Vitals and nursing note reviewed.  Constitutional:      General: She is not in acute distress.    Appearance: She is well-developed. She is not ill-appearing.  HENT:     Head: Normocephalic  and atraumatic.     Nose: Nose normal.     Mouth/Throat:     Mouth: Mucous membranes are moist.  Eyes:     Extraocular Movements: Extraocular movements intact.     Conjunctiva/sclera: Conjunctivae normal.     Pupils: Pupils are equal, round, and reactive to light.  Cardiovascular:     Rate and Rhythm: Normal rate and regular rhythm.     Heart sounds: No murmur heard. Pulmonary:     Effort: Pulmonary effort is normal. No respiratory distress.     Breath sounds: Normal breath sounds.  Abdominal:     Palpations: Abdomen is soft.     Tenderness: There is no  abdominal tenderness.  Musculoskeletal:        General: No swelling.     Cervical back: Normal range of motion and neck supple.  Skin:    General: Skin is warm and dry.     Capillary Refill: Capillary refill takes less than 2 seconds.  Neurological:     General: No focal deficit present.     Mental Status: She is alert.  Psychiatric:        Mood and Affect: Mood normal.     (all labs ordered are listed, but only abnormal results are displayed) Labs Reviewed  RESP PANEL BY RT-PCR (RSV, FLU A&B, COVID)  [id number]    EKG: EKG Interpretation Date/Time:  Thursday July 29 2024 09:41:21 EST Ventricular Rate:  75 PR Interval:  166 QRS Duration:  92 QT Interval:  392 QTC Calculation: 438 R Axis:   61  Text Interpretation: Normal sinus rhythm Confirmed by Ruthe Cornet [phone]) on 07/29/2024 9:43:31 AM  Radiology: No results found.   Procedures   Medications Ordered in the ED  predniSONE  (DELTASONE ) tablet 60 mg (60 mg Oral Given 07/29/24 0941)  albuterol  (VENTOLIN  HFA) 108 (90 Base) MCG/ACT inhaler 2 puff (2 puffs Inhalation Given 07/29/24 0953)                                    Medical Decision Making Amount and/or Complexity of Data Reviewed Radiology: ordered.  Risk Prescription drug management.   Mercedes Hoover is here with cough.  I suspect bronchitis process.  No major wheezing on exam.  But she has been coughing a lot using cough suppressants at home with some help.  She is unremarkable vitals.  No fever.  Well-appearing.  Differential diagnosis likely bronchitis but will evaluate for pneumonia viral panel.  Will get chest x-ray viral panel EKG.  Will give a dose of prednisone  here.  Will give some albuterol .  I have no concern for ACS PE or other acute process.  Chest x-ray per my review interpretation with no obvious pneumonia pneumothorax.  I suspect reactive airway process.  Given inhaler will start prednisone .  Discharge.  Understands return precautions.   Overall suspect bronchitis.  This chart was dictated using voice recognition software.  Despite best efforts to proofread,  errors can occur which can change the documentation meaning.      Final diagnoses:  Viral URI with cough    ED Discharge Orders          Ordered    predniSONE  (DELTASONE ) 20 MG tablet  Daily        07/29/24 1018               [place], DO 07/29/24 1019  "

## 2024-07-29 NOTE — ED Triage Notes (Signed)
 Pt has been sick, coughing runny nose, also has hx of asthma. Sickness onset about 1-2 weeks ago. Had rib pain yesterday that resolved with Ibuprofen .

## 2024-07-29 NOTE — ED Notes (Signed)
Patient educated on proper use of HFA medications with Aerochamber.  Patient demonstrates proper technique after education.
